# Patient Record
Sex: Male | Born: 1945 | Race: White | Hispanic: No | Marital: Married | Smoking: Never smoker
Health system: Southern US, Community
[De-identification: ages and names within clinical notes are randomized; demographics above are authoritative.]

## PROBLEM LIST (undated history)

## (undated) DIAGNOSIS — E119 Type 2 diabetes mellitus without complications: Secondary | ICD-10-CM

## (undated) DIAGNOSIS — C801 Malignant (primary) neoplasm, unspecified: Secondary | ICD-10-CM

## (undated) DIAGNOSIS — I10 Essential (primary) hypertension: Secondary | ICD-10-CM

## (undated) HISTORY — PX: COLON SURGERY: SHX602

## (undated) HISTORY — PX: ABDOMINAL SURGERY: SHX537

## (undated) HISTORY — PX: CHOLECYSTECTOMY: SHX55

## (undated) HISTORY — PX: HERNIA REPAIR: SHX51

---

## 2017-12-13 ENCOUNTER — Encounter: Payer: Self-pay | Admitting: Emergency Medicine

## 2017-12-13 ENCOUNTER — Other Ambulatory Visit: Payer: Self-pay

## 2017-12-13 ENCOUNTER — Emergency Department
Admission: EM | Admit: 2017-12-13 | Discharge: 2017-12-14 | Disposition: A | Discharge status: Home / Self Care | Payer: Medicare Other | Attending: Emergency Medicine | Admitting: Emergency Medicine

## 2017-12-13 DIAGNOSIS — E119 Type 2 diabetes mellitus without complications: Secondary | ICD-10-CM | POA: Diagnosis not present

## 2017-12-13 DIAGNOSIS — Z85858 Personal history of malignant neoplasm of other endocrine glands: Secondary | ICD-10-CM | POA: Insufficient documentation

## 2017-12-13 DIAGNOSIS — R110 Nausea: Secondary | ICD-10-CM | POA: Diagnosis not present

## 2017-12-13 DIAGNOSIS — R1032 Left lower quadrant pain: Secondary | ICD-10-CM

## 2017-12-13 DIAGNOSIS — I10 Essential (primary) hypertension: Secondary | ICD-10-CM | POA: Diagnosis not present

## 2017-12-13 HISTORY — DX: Malignant (primary) neoplasm, unspecified: C80.1

## 2017-12-13 HISTORY — DX: Essential (primary) hypertension: I10

## 2017-12-13 HISTORY — DX: Type 2 diabetes mellitus without complications: E11.9

## 2017-12-13 LAB — URINALYSIS, COMPLETE (UACMP) WITH MICROSCOPIC
BILIRUBIN URINE: NEGATIVE
Bacteria, UA: NONE SEEN
Glucose, UA: NEGATIVE mg/dL
HGB URINE DIPSTICK: NEGATIVE
Ketones, ur: NEGATIVE mg/dL
Leukocytes, UA: NEGATIVE
Nitrite: NEGATIVE
Protein, ur: NEGATIVE mg/dL
SPECIFIC GRAVITY, URINE: 1.019 (ref 1.005–1.030)
pH: 5 (ref 5.0–8.0)

## 2017-12-13 LAB — LIPASE, BLOOD: Lipase: 28 U/L (ref 11–51)

## 2017-12-13 LAB — COMPREHENSIVE METABOLIC PANEL
ALK PHOS: 98 U/L (ref 38–126)
ALT: 23 U/L (ref 17–63)
ANION GAP: 6 (ref 5–15)
AST: 40 U/L (ref 15–41)
Albumin: 4 g/dL (ref 3.5–5.0)
BILIRUBIN TOTAL: 0.5 mg/dL (ref 0.3–1.2)
BUN: 18 mg/dL (ref 6–20)
CALCIUM: 9.4 mg/dL (ref 8.9–10.3)
CO2: 32 mmol/L (ref 22–32)
CREATININE: 0.88 mg/dL (ref 0.61–1.24)
Chloride: 101 mmol/L (ref 101–111)
Glucose, Bld: 138 mg/dL — ABNORMAL HIGH (ref 65–99)
Potassium: 5.2 mmol/L — ABNORMAL HIGH (ref 3.5–5.1)
Sodium: 139 mmol/L (ref 135–145)
TOTAL PROTEIN: 7.9 g/dL (ref 6.5–8.1)

## 2017-12-13 LAB — CBC
HCT: 40.9 % (ref 40.0–52.0)
HEMOGLOBIN: 13.3 g/dL (ref 13.0–18.0)
MCH: 27.5 pg (ref 26.0–34.0)
MCHC: 32.4 g/dL (ref 32.0–36.0)
MCV: 84.9 fL (ref 80.0–100.0)
PLATELETS: 274 10*3/uL (ref 150–440)
RBC: 4.82 MIL/uL (ref 4.40–5.90)
RDW: 15.3 % — ABNORMAL HIGH (ref 11.5–14.5)
WBC: 9.9 10*3/uL (ref 3.8–10.6)

## 2017-12-13 MED ORDER — ONDANSETRON HCL 4 MG/2ML IJ SOLN
4.0000 mg | INTRAMUSCULAR | Status: DC | PRN
  Administered 2017-12-13: 4 mg via INTRAVENOUS
  Filled 2017-12-13: qty 2

## 2017-12-13 MED ORDER — IOPAMIDOL (ISOVUE-300) INJECTION 61%
30.0000 mL | Freq: Once | INTRAVENOUS | Status: AC | PRN
  Administered 2017-12-13: 30 mL via ORAL

## 2017-12-13 NOTE — ED Triage Notes (Addendum)
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

## 2017-12-13 NOTE — ED Provider Notes (Signed)
[facility] Emergency Department Provider Note  ____________________________________________  Time seen: Approximately 11:36 PM  I have reviewed the triage vital signs and the nursing notes.   HISTORY  Chief Complaint Abdominal Pain    HPI Edward Chung is a 72 y.o. male who complains of left lower abdominal pain since yesterday, off and on, no aggravating or alleviating factors, nonradiating, mild to moderate intensity. Associated with nausea but no vomiting. Multiple loose bowel movements as per his usual bowel habits without any acute change. No fevers chills or sweats.he does have decreased appetite.  The patient has a history of adrenal cortical tumor, multiple abdominal surgeries for tumor resections including gastrectomy, partial bowel resection, partial pancreatectomy, cholecystectomy.      Past Medical History:  Diagnosis Date  . Diabetes mellitus without complication ([place])   . Hypertension      There are no active problems to display for this patient.    Past Surgical History:  Procedure Laterality Date  . ABDOMINAL SURGERY    . CHOLECYSTECTOMY    . COLON SURGERY    . HERNIA REPAIR       Prior to Admission medications   Not on File     Allergies Patient has no known allergies.   No family history on file.  Social History Social History   Tobacco Use  . Smoking status: Never Smoker  . Smokeless tobacco: Never Used  Substance Use Topics  . Alcohol use: Not on file  . Drug use: Not on file    Review of Systems  Constitutional:   No fever or chills.  ENT:   No sore throat. No rhinorrhea. Cardiovascular:   No chest pain or syncope. Respiratory:   No dyspnea or cough. Gastrointestinal:   positive as above for abdominal pain without vomiting and diarrhea.  Musculoskeletal:   Negative for focal pain or swelling All other systems reviewed and are negative except as documented above in ROS and  HPI.  ____________________________________________   PHYSICAL EXAM:  VITAL SIGNS: ED Triage Vitals [12/13/17 2008]  Enc Vitals Group     BP (!) 153/91     Pulse Rate 78     Resp 18     Temp 98.3 F (36.8 C)     Temp Source Oral     SpO2 99 %     Weight 150 lb (68 kg)     Height 5\' 10"  (1.778 m)     Head Circumference      Peak Flow      Pain Score 4     Pain Loc      Pain Edu?      Excl. in [place]?     Vital signs reviewed, nursing assessments reviewed.   Constitutional:   Alert and oriented. Well appearing and in no distress. Eyes:   Conjunctivae are normal. EOMI. PERRL. ENT      Head:   Normocephalic and atraumatic.      Nose:   No congestion/rhinnorhea.       Mouth/Throat:   MMM, no pharyngeal erythema. No peritonsillar mass.       Neck:   No meningismus. Full ROM. Hematological/Lymphatic/Immunilogical:   No cervical lymphadenopathy. Cardiovascular:   RRR. Symmetric bilateral radial and DP pulses.  No murmurs.  Respiratory:   Normal respiratory effort without tachypnea/retractions. Breath sounds are clear and equal bilaterally. No wheezes/rales/rhonchi. Gastrointestinal:   Soft with minimal left lower quadrant tendernessr. Non distended. There is no CVA tenderness.  No rebound, rigidity,  or guarding. Genitourinary:   deferred Musculoskeletal:   Normal range of motion in all extremities. No joint effusions.  No lower extremity tenderness.  No edema. Neurologic:   Normal speech and language.  Motor grossly intact. No acute focal neurologic deficits are appreciated.  Skin:    Skin is warm, dry and intact. No rash noted.  No petechiae, purpura, or bullae.  ____________________________________________    LABS (pertinent positives/negatives) (all labs ordered are listed, but only abnormal results are displayed) Labs Reviewed  COMPREHENSIVE METABOLIC PANEL - Abnormal; Notable for the following components:      Result Value   Potassium 5.2 (*)    Glucose, Bld 138 (*)     All other components within normal limits  CBC - Abnormal; Notable for the following components:   RDW 15.3 (*)    All other components within normal limits  URINALYSIS, COMPLETE (UACMP) WITH MICROSCOPIC - Abnormal; Notable for the following components:   Color, Urine YELLOW (*)    APPearance CLEAR (*)    Squamous Epithelial / LPF 0-5 (*)    All other components within normal limits  LIPASE, BLOOD   ____________________________________________   EKG    ____________________________________________    RADIOLOGY  No results found.  ____________________________________________   PROCEDURES Procedures  ____________________________________________  DIFFERENTIAL DIAGNOSIS   bowel obstruction, perforation, diverticulitis  CLINICAL IMPRESSION / ASSESSMENT AND PLAN / ED COURSE  Pertinent labs & imaging results that were available during my care of the patient were reviewed by me and considered in my medical decision making (see chart for details).    patient presents with abdominal pain. With his complicated medical history and multiple abdominal surgeries, along with diabetes history, we'll obtain a CT scan to further evaluate. If there are no significant findings, I think the patient is suitable for discharge home given that he is well-appearing with overall reassuring exam and vital signs. Care of the patient will be signed out to Dr. Beather Arbour pending CT.  Clinical Course as of Dec 14 2334  Mon Dec 13, 2017  2232 Appearance(!): CLEAR [PS]    Clinical Course User Index [PS] Carrie Mew, MD     ____________________________________________   FINAL CLINICAL IMPRESSION(S) / ED DIAGNOSES    Final diagnoses:  Left lower quadrant pain     ED Discharge Orders    None      Portions of this note were generated with dragon dictation software. Dictation errors may occur despite best attempts at proofreading.    Carrie Mew, MD 12/13/17 [phone]

## 2017-12-14 ENCOUNTER — Emergency Department: Payer: Medicare Other

## 2017-12-14 ENCOUNTER — Encounter: Payer: Self-pay | Admitting: Radiology

## 2017-12-14 DIAGNOSIS — R1032 Left lower quadrant pain: Secondary | ICD-10-CM | POA: Diagnosis not present

## 2017-12-14 MED ORDER — DICYCLOMINE HCL 20 MG PO TABS: 20.0000 mg | ORAL_TABLET | Freq: Four times a day (QID) | ORAL | 0 refills | Status: AC | PRN

## 2017-12-14 MED ORDER — ONDANSETRON 4 MG PO TBDP: 4.0000 mg | ORAL_TABLET | Freq: Three times a day (TID) | ORAL | 0 refills | Status: AC | PRN

## 2017-12-14 MED ORDER — IOHEXOL 300 MG/ML  SOLN
100.0000 mL | Freq: Once | INTRAMUSCULAR | Status: AC | PRN
  Administered 2017-12-14: 100 mL via INTRAVENOUS

## 2017-12-14 MED ORDER — DICYCLOMINE HCL 10 MG/ML IM SOLN
20.0000 mg | Freq: Once | INTRAMUSCULAR | Status: AC
  Administered 2017-12-14: 20 mg via INTRAMUSCULAR
  Filled 2017-12-14: qty 2

## 2017-12-14 NOTE — Discharge Instructions (Addendum)
1.  You may take medicines as needed for abdominal discomfort and nausea (Bentyl/Zofran #20). 2.  Eat a clear liquid diet for the next 12-24 hours, then bland diet for 2-3 days.  Slowly advance diet as tolerated.  Avoid heavy, greasy, spicy foods and alcohol. 3.  Return to the ER for worsening symptoms, persistent vomiting, difficulty breathing or other concerns.

## 2017-12-14 NOTE — ED Provider Notes (Signed)
-----------------------------------------   2:13 AM on 12/14/2017 -----------------------------------------  CT abdomen pelvis with contrast interpreted per Dr. Dorann Lodge:  1. Two complex LEFT renal mass measuring to 3.2 cm. Given likelihood  of prior imaging, recommend comparison to status for stability.  Recommend renal protocol MRI with and without contrast if  comparisons cannot be obtained.  2. Extensive bowel surgery with patulous small bowel anastomosis, no  obstruction.  3. Status post splenectomy, distal pancreatectomy, LEFT  adrenalectomy, gastrectomy.  4. Nonobstructing RIGHT nephrolithiasis. Mildly atrophic RIGHT  kidney.   Updated patient and spouse on CT results demonstrating no SBO.  He has known left renal masses from CT scan last month and states he is currently trying to get in touch with the surgeon.  Overall feeling better and tolerated oral contrast without vomiting.  They are currently visiting family from out of town and are supposed to travel to [place] in 1 day.  We had an extensive discussion about whether or not he would be safe to travel.  I explained that while he does not have a SBO tonight, medicine is dynamic and his symptoms may progress to SBO in the next few days.  I encouraged him to follow a liquid diet for the next 12-24 hours, advancing to bland foods such as saltines and jello.  Copies of patient's lab work and CT scan given to them for their records.  Will prescribe Bentyl and Zofran to use as needed.  Explained that we do not wish to mask any developing or changing symptoms with narcotic pain medicines.  Answered all questions that patient and his spouse had.  Strict return precautions given.  Both verbalized understanding and agree with plan of care.   Paulette Blanch, MD 12/14/17 [phone]

## 2019-07-26 IMAGING — CT CT ABD-PELV W/ CM
2 of 5 series · 15 of 46 positions shown, 17 images · IV contrast (APPLIED)
Comparison: None.

CLINICAL DATA: Lower abdominal pain, suspect bowel obstruction.
History of gastrectomy, splenectomy, LEFT adrenalectomy, partial
pancreatectomy, cholecystectomy, colon surgery, hernia repair, small
bowel obstruction.

EXAM:
CT ABDOMEN AND PELVIS WITH CONTRAST
TECHNIQUE: Multidetector CT imaging of the abdomen and pelvis was performed
using the standard protocol following bolus administration of
intravenous contrast.
CONTRAST:  100mL OMNIPAQUE IOHEXOL 300 MG/ML  SOLN

[Series 4: routine abd/pel with · axial · 0.70mm/px · z∈[+41,+421]mm · 12 of 86 slices shown, 14 images]
[im 5/86  soft-tissue]
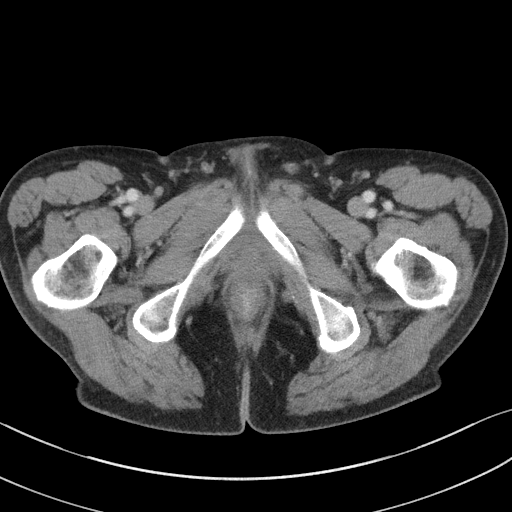
[im 5/86  bone]
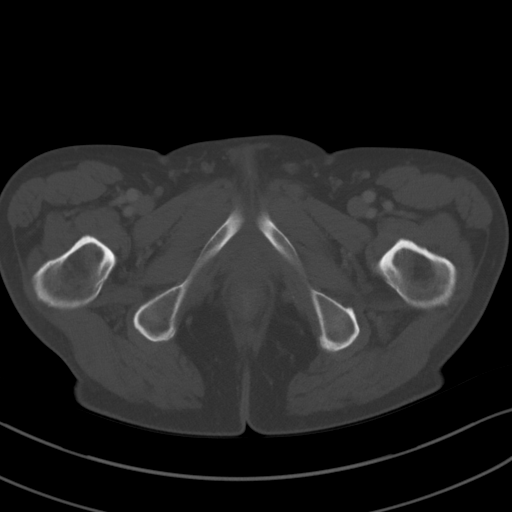
[im 15/86  soft-tissue]
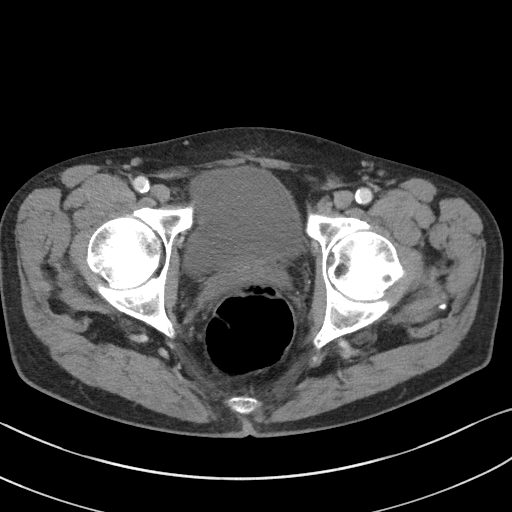
[im 19/86  soft-tissue]
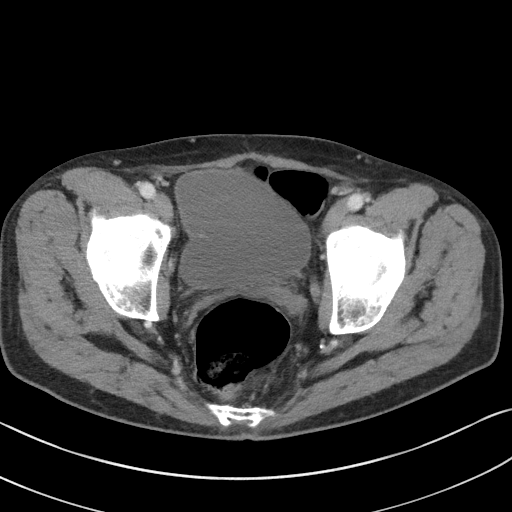
[im 24/86  soft-tissue]
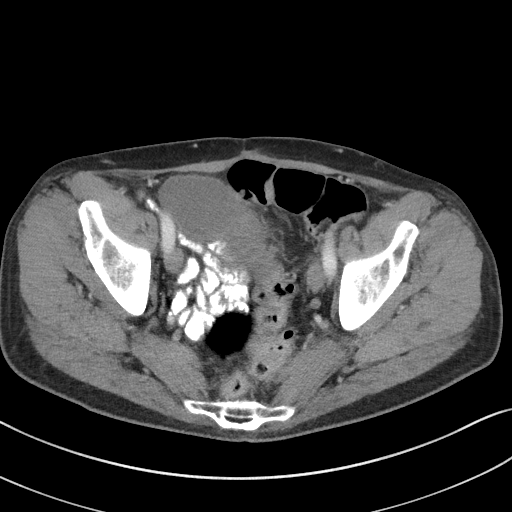
[im 34/86  soft-tissue]
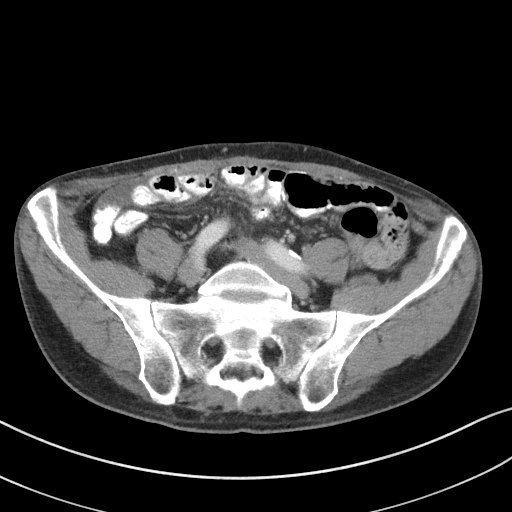
[im 38/86  soft-tissue]
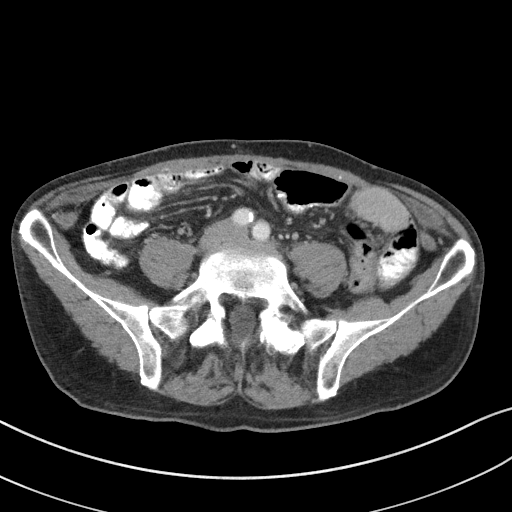
[im 48/86  soft-tissue]
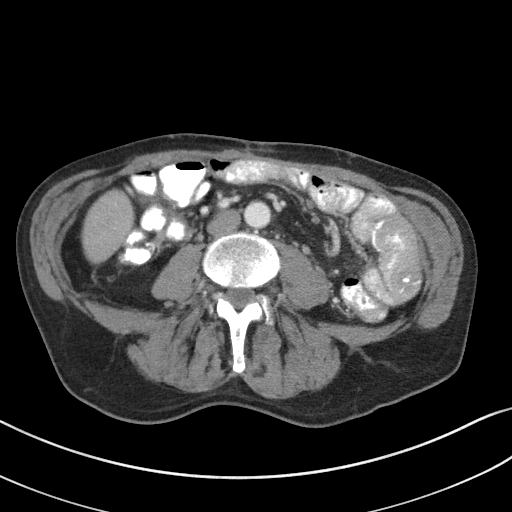
[im 52/86  soft-tissue]
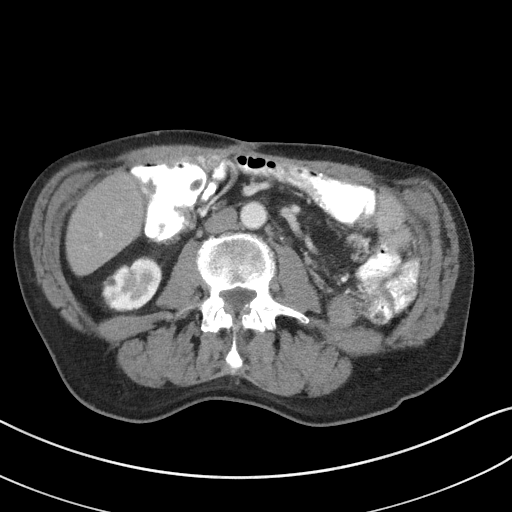
[im 62/86  soft-tissue]
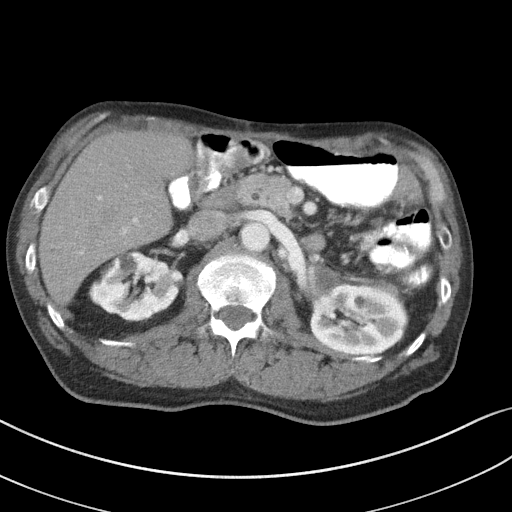
[im 62/86  bone]
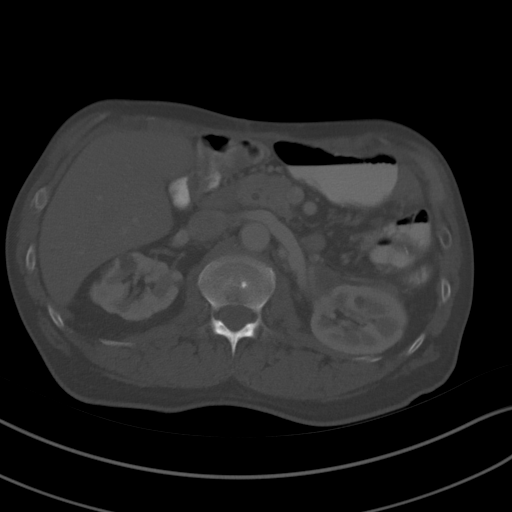
[im 67/86  soft-tissue]
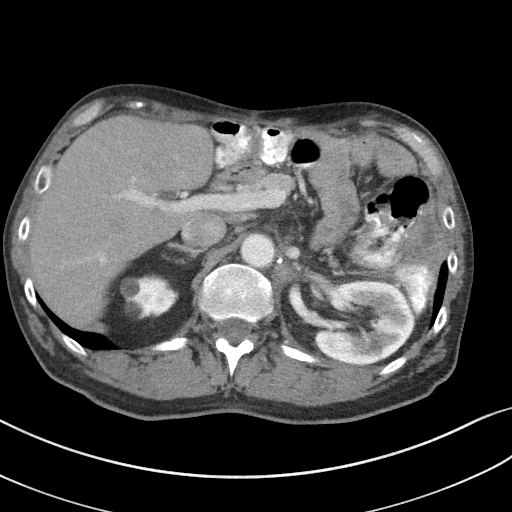
[im 71/86  soft-tissue]
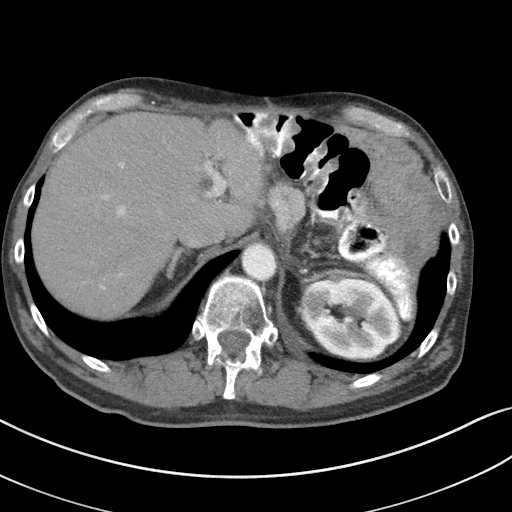
[im 81/86  soft-tissue]
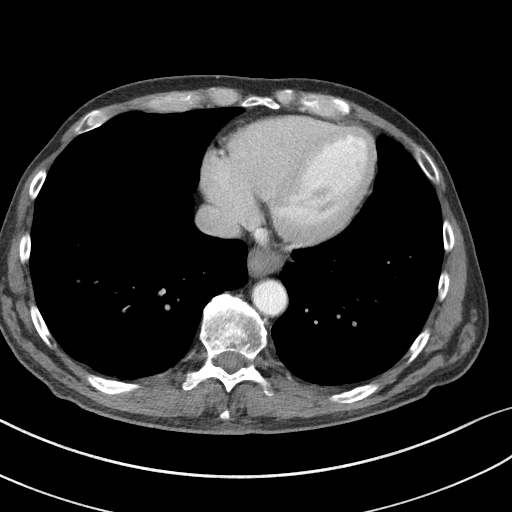

[Series 7: coronal st · coronal · 0.67mm/px · 3 of 81 slices shown]
[im 27/81  soft-tissue]
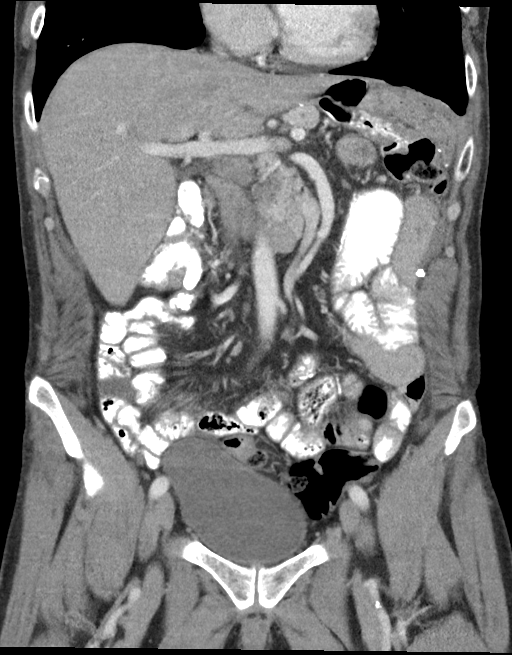
[im 36/81  soft-tissue]
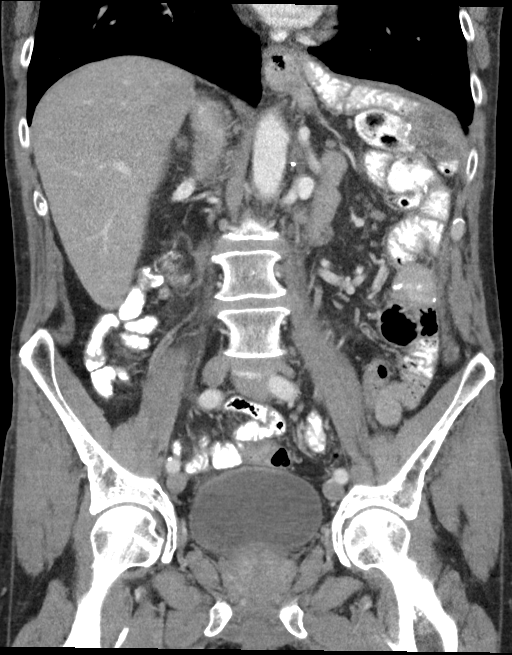
[im 45/81  soft-tissue]
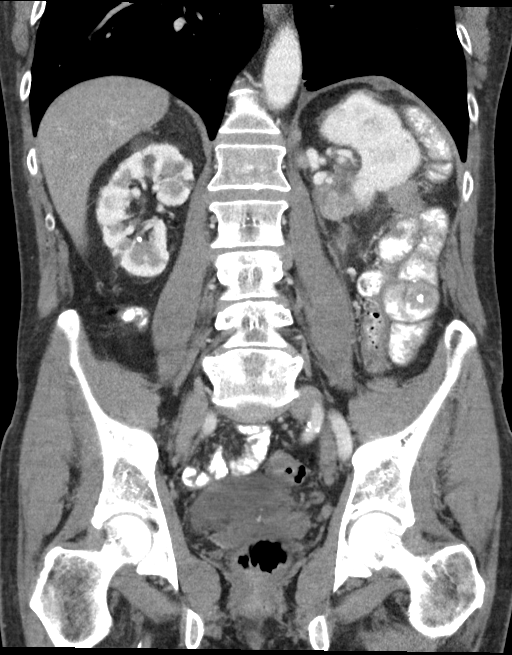

[15 of 46 positions shown; findings below may reference images not displayed]

FINDINGS: LOWER CHEST: LEFT lung base scarring. Included heart size is normal.
No pericardial effusion.

HEPATOBILIARY: Status post cholecystectomy.  Negative liver.

PANCREAS: Status post partial pancreatectomy at midbody.

SPLEEN: Surgically absent.

ADRENALS/URINARY TRACT: Kidneys are orthotopic, demonstrating
symmetric enhancement. Mildly atrophic RIGHT kidney. 2 mm RIGHT mid
pole, 3 mm RIGHT lower pole nephrolithiasis. LEFT upper and RIGHT
lower pole scarring. Complex cystic exophytic mass 2.7 x 3.2 cm
exophytic from lower pole. Additional complex 2.6 cm cyst lower pole
of LEFT kidney. Additional benign-appearing bilateral renal cysts
measuring to 13 mm. No hydronephrosis or solid renal masses. The
unopacified ureters are normal in course and caliber. Delayed
imaging through the kidneys demonstrates symmetric prompt contrast
excretion within the proximal urinary collecting system. Urinary
bladder is partially distended and unremarkable. LEFT adrenal gland
surgically absent. Normal RIGHT adrenal gland.

STOMACH/BOWEL: Small hiatal hernia additionally containing a loop of
small bowel. Status post gastrectomy. Mild sigmoid colonic
diverticulosis. Matted appearance of small bowel LEFT upper
quadrant, bubble the level of small bowel surgical anastomosis.
Status post LEFT subtotal colectomy. Patulous appearance of LEFT
upper quadrant side-to-side anastomosis without obstruction.

VASCULAR/LYMPHATIC: Aortoiliac vessels are normal in course and
caliber. Trace calcific atherosclerosis. No lymphadenopathy by CT
size criteria.

REPRODUCTIVE: Mild prostatomegaly.

OTHER: No intraperitoneal free fluid or free air.

MUSCULOSKELETAL: Nonacute. Moderate LEFT hip osteoarthrosis. Grade 1
L5-S1 anterolisthesis without spondylolysis.
IMPRESSION: 1. Two complex LEFT renal mass measuring to 3.2 cm. Given likelihood
of prior imaging, recommend comparison to status for stability.
Recommend renal protocol MRI with and without contrast if
comparisons cannot be obtained.
2. Extensive bowel surgery with patulous small bowel anastomosis, no
obstruction.
3. Status post splenectomy, distal pancreatectomy, LEFT
adrenalectomy, gastrectomy.
4. Nonobstructing RIGHT nephrolithiasis. Mildly atrophic RIGHT
kidney.

Aortic Atherosclerosis ([id number]).
